# Patient Record
Sex: Male | Born: 1985 | Race: Black or African American | Hispanic: No | Marital: Single | Smoking: Current every day smoker
Health system: Southern US, Community
[De-identification: ages and names within clinical notes are randomized; demographics above are authoritative.]

## PROBLEM LIST (undated history)

## (undated) DIAGNOSIS — J45909 Unspecified asthma, uncomplicated: Secondary | ICD-10-CM

## (undated) DIAGNOSIS — S82891A Other fracture of right lower leg, initial encounter for closed fracture: Secondary | ICD-10-CM

---
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
## 2013-05-14 ENCOUNTER — Emergency Department (HOSPITAL_BASED_OUTPATIENT_CLINIC_OR_DEPARTMENT_OTHER)
Admission: EM | Admit: 2013-05-14 | Discharge: 2013-05-14 | Disposition: A | Discharge status: Home / Self Care | Payer: Self-pay | Attending: Emergency Medicine | Admitting: Emergency Medicine

## 2013-05-14 ENCOUNTER — Emergency Department (HOSPITAL_BASED_OUTPATIENT_CLINIC_OR_DEPARTMENT_OTHER): Payer: Self-pay

## 2013-05-14 ENCOUNTER — Encounter (HOSPITAL_BASED_OUTPATIENT_CLINIC_OR_DEPARTMENT_OTHER): Payer: Self-pay | Admitting: Emergency Medicine

## 2013-05-14 DIAGNOSIS — S0181XA Laceration without foreign body of other part of head, initial encounter: Secondary | ICD-10-CM

## 2013-05-14 DIAGNOSIS — R000 Tachycardia, unspecified: Secondary | ICD-10-CM | POA: Insufficient documentation

## 2013-05-14 DIAGNOSIS — Y9301 Activity, walking, marching and hiking: Secondary | ICD-10-CM | POA: Insufficient documentation

## 2013-05-14 DIAGNOSIS — F17200 Nicotine dependence, unspecified, uncomplicated: Secondary | ICD-10-CM | POA: Insufficient documentation

## 2013-05-14 DIAGNOSIS — S0180XA Unspecified open wound of other part of head, initial encounter: Secondary | ICD-10-CM | POA: Insufficient documentation

## 2013-05-14 DIAGNOSIS — J45909 Unspecified asthma, uncomplicated: Secondary | ICD-10-CM | POA: Insufficient documentation

## 2013-05-14 DIAGNOSIS — Y929 Unspecified place or not applicable: Secondary | ICD-10-CM | POA: Insufficient documentation

## 2013-05-14 DIAGNOSIS — R296 Repeated falls: Secondary | ICD-10-CM | POA: Insufficient documentation

## 2013-05-14 HISTORY — DX: Unspecified asthma, uncomplicated: J45.909

## 2013-05-14 LAB — BASIC METABOLIC PANEL
BUN: 11 mg/dL (ref 6–23)
CO2: 26 mEq/L (ref 19–32)
Calcium: 9.6 mg/dL (ref 8.4–10.5)
Chloride: 103 mEq/L (ref 96–112)
Creatinine, Ser: 0.9 mg/dL (ref 0.50–1.35)
GFR calc Af Amer: >90 mL/min (ref >90)
GFR calc non Af Amer: >90 mL/min (ref >90)
Glucose, Bld: 82 mg/dL (ref 70–99)
Potassium: 3.1 mEq/L — ABNORMAL LOW (ref 3.5–5.1)
Sodium: 142 mEq/L (ref 135–145)

## 2013-05-14 LAB — CBC WITH DIFFERENTIAL/PLATELET
Basophils Absolute: 0 10*3/uL (ref 0.0–0.1)
Basophils Relative: 1 % (ref 0–1)
Eosinophils Absolute: 0 10*3/uL (ref 0.0–0.7)
Eosinophils Relative: 0 % (ref 0–5)
HCT: 39.3 % (ref 39.0–52.0)
Hemoglobin: 13.4 g/dL (ref 13.0–17.0)
Lymphocytes Relative: 21 % (ref 12–46)
Lymphs Abs: 1.8 10*3/uL (ref 0.7–4.0)
MCH: 31.2 pg (ref 26.0–34.0)
MCHC: 34.1 g/dL (ref 30.0–36.0)
MCV: 91.4 fL (ref 78.0–100.0)
Monocytes Absolute: 0.5 10*3/uL (ref 0.1–1.0)
Monocytes Relative: 6 % (ref 3–12)
Neutro Abs: 6.3 10*3/uL (ref 1.7–7.7)
Neutrophils Relative %: 73 % (ref 43–77)
Platelets: 350 10*3/uL (ref 150–400)
RBC: 4.3 MIL/uL (ref 4.22–5.81)
RDW: 13.5 % (ref 11.5–15.5)
WBC: 8.6 10*3/uL (ref 4.0–10.5)

## 2013-05-14 MED ORDER — IOHEXOL 300 MG/ML  SOLN
80.0000 mL | Freq: Once | INTRAMUSCULAR | Status: AC | PRN
  Administered 2013-05-14: 80 mL via INTRAVENOUS

## 2013-05-14 MED ORDER — TETANUS-DIPHTHERIA TOXOIDS TD 5-2 LFU IM INJ
INJECTION | INTRAMUSCULAR | Status: AC
  Administered 2013-05-14: 0.5 mL via INTRAMUSCULAR
  Filled 2013-05-14: qty 0.5
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
## 2013-05-14 NOTE — ED Notes (Signed)
Pt informed that he would need to be transported to [facility] ED for consult with ENT doctor and pt agreed for transfer.

## 2013-05-14 NOTE — ED Notes (Addendum)
Pt nonverbal significant other reports pt fell when asked what he fell against pt reports he doesn't know
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
## 2013-05-14 NOTE — ED Notes (Signed)
Pt left AMA °

## 2013-05-14 NOTE — ED Notes (Signed)
Pt called out and upon contact, patient said he doesn't want any treatment, doesn't want to be transfer to [facility]. Pt educated on the potential risks, after being educated,  he was still very adamant about his decision. "I want to get this laceration suture up otherwise I will just go to [facility]". 10 min later, he was waiting outside his room demanding his IV to be pulled out and then started to pull his own IV out. After IV was pulled out, he got dressed and walked out the front door.

## 2017-11-06 ENCOUNTER — Other Ambulatory Visit: Payer: Self-pay

## 2017-11-06 ENCOUNTER — Emergency Department (HOSPITAL_BASED_OUTPATIENT_CLINIC_OR_DEPARTMENT_OTHER): Payer: Self-pay

## 2017-11-06 ENCOUNTER — Emergency Department (HOSPITAL_BASED_OUTPATIENT_CLINIC_OR_DEPARTMENT_OTHER)
Admission: EM | Admit: 2017-11-06 | Discharge: 2017-11-06 | Disposition: A | Discharge status: Home / Self Care | Payer: Self-pay | Attending: Emergency Medicine | Admitting: Emergency Medicine

## 2017-11-06 DIAGNOSIS — Y999 Unspecified external cause status: Secondary | ICD-10-CM | POA: Insufficient documentation

## 2017-11-06 DIAGNOSIS — S82831A Other fracture of upper and lower end of right fibula, initial encounter for closed fracture: Secondary | ICD-10-CM | POA: Insufficient documentation

## 2017-11-06 DIAGNOSIS — J45909 Unspecified asthma, uncomplicated: Secondary | ICD-10-CM | POA: Insufficient documentation

## 2017-11-06 DIAGNOSIS — W1789XA Other fall from one level to another, initial encounter: Secondary | ICD-10-CM | POA: Insufficient documentation

## 2017-11-06 DIAGNOSIS — F17210 Nicotine dependence, cigarettes, uncomplicated: Secondary | ICD-10-CM | POA: Insufficient documentation

## 2017-11-06 DIAGNOSIS — Y929 Unspecified place or not applicable: Secondary | ICD-10-CM | POA: Insufficient documentation

## 2017-11-06 DIAGNOSIS — Y939 Activity, unspecified: Secondary | ICD-10-CM | POA: Insufficient documentation

## 2017-11-06 MED ORDER — OXYCODONE-ACETAMINOPHEN 5-325 MG PO TABS: 1.0000 | ORAL_TABLET | Freq: Four times a day (QID) | ORAL | 0 refills | Status: DC | PRN

## 2017-11-06 MED ORDER — OXYCODONE-ACETAMINOPHEN 5-325 MG PO TABS: 2.0000 | ORAL_TABLET | Freq: Once | ORAL | Status: DC

## 2017-11-06 NOTE — Discharge Instructions (Signed)
Not put weight on your ankle until seen in follow-up by Dr. Durwin GlazeLandau  Percocet for pain.  Motrin for mild pain.

## 2017-11-06 NOTE — ED Provider Notes (Signed)
[facility] EMERGENCY DEPARTMENT Provider Note   CSN: [id number] Arrival date & time: 11/06/17  2228     History   Chief Complaint Chief Complaint  Patient presents with  . Ankle Pain    HPI Dustin King is a 32 y.o. male.  Chief complaint is ankle pain.  HPI: 32 year old male.  [place]y [place]  Lead of his right ankle.  It inverted.  He felt a pop and heard a cracking sound.  He cannot bear weight and has swelling.  There are areas of pain or injury.  He does not have pain or tenderness over the proximal lower leg.  Past Medical History:  Diagnosis Date  . Asthma     There are no active problems to display for this patient.   No past surgical history on file.     Home Medications    Prior to Admission medications   Medication Sig Start Date End Date Taking? Authorizing Provider  oxyCODONE-acetaminophen (PERCOCET/ROXICET) 5-325 MG tablet Take 1 tablet by mouth every 6 (six) hours as needed for moderate pain. 11/06/17   Rolland PorterJames, Ashton Sabine, MD    Family History No family history on file.  Social History Social History   Tobacco Use  . Smoking status: Current Every Day Smoker    Types: Cigarettes  Substance Use Topics  . Alcohol use: Yes  . Drug use: No     Allergies   Patient has no known allergies.   Review of Systems Review of Systems  Constitutional: Negative for appetite change, chills, diaphoresis, fatigue and fever.  HENT: Negative for mouth sores, sore throat and trouble swallowing.   Eyes: Negative for visual disturbance.  Respiratory: Negative for cough, chest tightness, shortness of breath and wheezing.   Cardiovascular: Negative for chest pain.  Gastrointestinal: Negative for abdominal distention, abdominal pain, diarrhea, nausea and vomiting.  Endocrine: Negative for polydipsia, polyphagia and polyuria.  Genitourinary: Negative for dysuria, frequency and hematuria.  Musculoskeletal: Positive for arthralgias and gait problem.   Skin: Negative for color change, pallor and rash.  Neurological: Negative for dizziness, syncope, light-headedness and headaches.  Hematological: Does not bruise/bleed easily.  Psychiatric/Behavioral: Negative for behavioral problems and confusion.     Physical Exam Updated Vital Signs BP (!) 144/126 (BP Location: Right Arm)   Pulse (!) 123   Temp 99 F (37.2 C) (Oral)   Resp 20   Ht 6' (1.829 m)   Wt 129.3 kg (285 lb)   SpO2 96%   BMI 38.65 kg/m   Physical Exam  Constitutional: He is oriented to person, place, and time. He appears well-developed and well-nourished. No distress.  HENT:  Head: Normocephalic.  Eyes: Conjunctivae are normal. Pupils are equal, round, and reactive to light. No scleral icterus.  Neck: Normal range of motion. Neck supple. No thyromegaly present.  Cardiovascular: Normal rate and regular rhythm. Exam reveals no gallop and no friction rub.  No murmur heard. Pulmonary/Chest: Effort normal and breath sounds normal. No respiratory distress. He has no wheezes. He has no rales.  Abdominal: Soft. Bowel sounds are normal. He exhibits no distension. There is no tenderness. There is no rebound.  Musculoskeletal: Normal range of motion.  Tenderness over the distal fibula.  No tenderness of the proximal tibia or fibula.  Normal range of motion of the knee.  No tender over the metatarsals including the base of the fifth.  Tissue swelling.  No ecchymosis.  Good sensation distally  Neurological: He is alert and oriented to  person, place, and time.  Skin: Skin is warm and dry. No rash noted.  Psychiatric: He has a normal mood and affect. His behavior is normal.     ED Treatments / Results  Labs (all labs ordered are listed, but only abnormal results are displayed) Labs Reviewed - No data to display  EKG  EKG Interpretation None       Radiology Dg Ankle Complete Right  Result Date: 11/06/2017 CLINICAL DATA:  Right ankle pain and swelling after fall off  porch. EXAM: RIGHT ANKLE - COMPLETE 3+ VIEW COMPARISON:  None. FINDINGS: Mildly displaced and minimally comminuted distal fibular fracture just proximal to the ankle mortise. No associated mortise widening. Tiny osseous fragment adjacent to the posterior tibial tubercle appears well corticated. No definite medial malleolar fracture. There is lateral soft tissue edema. IMPRESSION: 1. Mildly displaced and minimally comminuted distal fibular fracture. 2. Tiny osseous density posterior to the posterior malleolus appears well corticated and is likely chronic. Electronically Signed   By: Rubye Oaks M.D.   On: 11/06/2017 23:03    Procedures Procedures (including critical care time)  Medications Ordered in ED Medications  oxyCODONE-acetaminophen (PERCOCET/ROXICET) 5-325 MG per tablet 2 tablet (not administered)     Initial Impression / Assessment and Plan / ED Course  I have reviewed the triage vital signs and the nursing notes.  Pertinent labs & imaging results that were available during my care of the patient were reviewed by me and considered in my medical decision making (see chart for details).    X-rays show a distal fibula fracture proximal to the ankle mortise.  No obvious widening of the mortise.  Normal medial malleolus.  SPLINT APPLICATION Date/Time: 11:31 PM Authorized by: Claudean Kinds Consent: Verbal consent obtained. Risks and benefits: risks, benefits and alternatives were discussed Consent given by: patient Splint applied by: +orthopedic technician Location details: Ankle Splint type: posterior with sugar tong. Co-aptation Supplies used: Orthoglass, ace wraps Post-procedure: The splinted body part was neurovascularly unchanged following the procedure. Patient tolerance: Patient tolerated the procedure well with no immediate complications.  Was placed.  Remains neurovascular intact distally.  Was instructed to be nonweightbearing.  Given orthopedics for follow-up.   Percocet for pain.  Crutches.   Final Clinical Impressions(s) / ED Diagnoses   Final diagnoses:  Closed fracture of distal end of right fibula, unspecified fracture morphology, initial encounter    ED Discharge Orders        Ordered    oxyCODONE-acetaminophen (PERCOCET/ROXICET) 5-325 MG tablet  Every 6 hours PRN     11/06/17 2325       Rolland Porter, MD 11/06/17 [phone]

## 2017-11-06 NOTE — ED Notes (Signed)
Pt did not want to take ordered percocet
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
## 2017-11-06 NOTE — ED Notes (Signed)
Sprite given at d/c per pt request.

## 2017-11-10 ENCOUNTER — Encounter (HOSPITAL_BASED_OUTPATIENT_CLINIC_OR_DEPARTMENT_OTHER): Payer: Self-pay | Admitting: *Deleted

## 2017-11-10 ENCOUNTER — Other Ambulatory Visit: Payer: Self-pay

## 2017-11-11 ENCOUNTER — Other Ambulatory Visit: Payer: Self-pay

## 2017-11-11 ENCOUNTER — Ambulatory Visit (HOSPITAL_BASED_OUTPATIENT_CLINIC_OR_DEPARTMENT_OTHER)
Admission: RE | Admit: 2017-11-11 | Discharge: 2017-11-11 | Disposition: A | Discharge status: Home / Self Care | Payer: Self-pay | Source: Ambulatory Visit | Attending: Orthopedic Surgery | Admitting: Orthopedic Surgery

## 2017-11-11 ENCOUNTER — Encounter (HOSPITAL_BASED_OUTPATIENT_CLINIC_OR_DEPARTMENT_OTHER)
Admission: RE | Disposition: A | Discharge status: Home / Self Care | Payer: Self-pay | Source: Ambulatory Visit | Attending: Orthopedic Surgery

## 2017-11-11 ENCOUNTER — Encounter (HOSPITAL_BASED_OUTPATIENT_CLINIC_OR_DEPARTMENT_OTHER): Payer: Self-pay | Admitting: *Deleted

## 2017-11-11 ENCOUNTER — Ambulatory Visit (HOSPITAL_BASED_OUTPATIENT_CLINIC_OR_DEPARTMENT_OTHER): Payer: Self-pay | Admitting: Anesthesiology

## 2017-11-11 DIAGNOSIS — F17210 Nicotine dependence, cigarettes, uncomplicated: Secondary | ICD-10-CM | POA: Insufficient documentation

## 2017-11-11 DIAGNOSIS — S82891A Other fracture of right lower leg, initial encounter for closed fracture: Secondary | ICD-10-CM

## 2017-11-11 DIAGNOSIS — X501XXA Overexertion from prolonged static or awkward postures, initial encounter: Secondary | ICD-10-CM | POA: Insufficient documentation

## 2017-11-11 DIAGNOSIS — S82841A Displaced bimalleolar fracture of right lower leg, initial encounter for closed fracture: Secondary | ICD-10-CM | POA: Insufficient documentation

## 2017-11-11 DIAGNOSIS — W1789XA Other fall from one level to another, initial encounter: Secondary | ICD-10-CM | POA: Insufficient documentation

## 2017-11-11 DIAGNOSIS — Z6841 Body Mass Index (BMI) 40.0 and over, adult: Secondary | ICD-10-CM | POA: Insufficient documentation

## 2017-11-11 HISTORY — PX: ORIF FIBULA FRACTURE: SHX5114

## 2017-11-11 HISTORY — DX: Other fracture of right lower leg, initial encounter for closed fracture: S82.891A

## 2017-11-11 SURGERY — OPEN REDUCTION INTERNAL FIXATION (ORIF) FIBULA FRACTURE
Anesthesia: General | Site: Ankle | Laterality: Right

## 2017-11-11 MED ORDER — ONDANSETRON HCL 4 MG/2ML IJ SOLN
INTRAMUSCULAR | Status: DC | PRN
  Administered 2017-11-11: 4 mg via INTRAVENOUS

## 2017-11-11 MED ORDER — LIDOCAINE HCL (CARDIAC) 20 MG/ML IV SOLN
INTRAVENOUS | Status: DC | PRN
  Administered 2017-11-11: 60 mg via INTRAVENOUS

## 2017-11-11 MED ORDER — ONDANSETRON HCL 4 MG/2ML IJ SOLN
INTRAMUSCULAR | Status: AC
  Filled 2017-11-11: qty 2

## 2017-11-11 MED ORDER — DEXAMETHASONE SODIUM PHOSPHATE 10 MG/ML IJ SOLN
INTRAMUSCULAR | Status: AC
  Filled 2017-11-11: qty 1

## 2017-11-11 MED ORDER — CEFAZOLIN SODIUM-DEXTROSE 1-4 GM/50ML-% IV SOLN
INTRAVENOUS | Status: AC
  Filled 2017-11-11: qty 50

## 2017-11-11 MED ORDER — FENTANYL CITRATE (PF) 100 MCG/2ML IJ SOLN
50.0000 ug | INTRAMUSCULAR | Status: AC | PRN
  Administered 2017-11-11: 100 ug via INTRAVENOUS
  Administered 2017-11-11: 25 ug via INTRAVENOUS
  Administered 2017-11-11: 50 ug via INTRAVENOUS
  Administered 2017-11-11: 25 ug via INTRAVENOUS

## 2017-11-11 MED ORDER — MIDAZOLAM HCL 2 MG/2ML IJ SOLN
1.0000 mg | INTRAMUSCULAR | Status: DC | PRN
  Administered 2017-11-11: 2 mg via INTRAVENOUS

## 2017-11-11 MED ORDER — CEFAZOLIN SODIUM-DEXTROSE 2-4 GM/100ML-% IV SOLN
INTRAVENOUS | Status: AC
  Filled 2017-11-11: qty 100

## 2017-11-11 MED ORDER — PROPOFOL 10 MG/ML IV BOLUS
INTRAVENOUS | Status: DC | PRN
  Administered 2017-11-11: 300 mg via INTRAVENOUS

## 2017-11-11 MED ORDER — CEFAZOLIN SODIUM-DEXTROSE 2-4 GM/100ML-% IV SOLN
2.0000 g | INTRAVENOUS | Status: AC
  Administered 2017-11-11: 3 g via INTRAVENOUS

## 2017-11-11 MED ORDER — FENTANYL CITRATE (PF) 100 MCG/2ML IJ SOLN
INTRAMUSCULAR | Status: AC
  Filled 2017-11-11: qty 2

## 2017-11-11 MED ORDER — DEXAMETHASONE SODIUM PHOSPHATE 10 MG/ML IJ SOLN
INTRAMUSCULAR | Status: DC | PRN
  Administered 2017-11-11: 10 mg via INTRAVENOUS

## 2017-11-11 MED ORDER — LIDOCAINE 2% (20 MG/ML) 5 ML SYRINGE
INTRAMUSCULAR | Status: AC
  Filled 2017-11-11: qty 5

## 2017-11-11 MED ORDER — OXYCODONE HCL 5 MG/5ML PO SOLN: 5.0000 mg | Freq: Once | ORAL | Status: DC | PRN

## 2017-11-11 MED ORDER — PROMETHAZINE HCL 25 MG/ML IJ SOLN: 6.2500 mg | INTRAMUSCULAR | Status: DC | PRN

## 2017-11-11 MED ORDER — MIDAZOLAM HCL 2 MG/2ML IJ SOLN
INTRAMUSCULAR | Status: AC
  Filled 2017-11-11: qty 2

## 2017-11-11 MED ORDER — PHENYLEPHRINE 40 MCG/ML (10ML) SYRINGE FOR IV PUSH (FOR BLOOD PRESSURE SUPPORT)
PREFILLED_SYRINGE | INTRAVENOUS | Status: AC
Start: 2017-11-11 — End: ?
  Filled 2017-11-11: qty 30

## 2017-11-11 MED ORDER — GLYCOPYRROLATE 0.2 MG/ML IJ SOLN
INTRAMUSCULAR | Status: DC | PRN
  Administered 2017-11-11 (×2): 0.1 mg via INTRAVENOUS

## 2017-11-11 MED ORDER — PHENYLEPHRINE HCL 10 MG/ML IJ SOLN
INTRAVENOUS | Status: DC | PRN
  Administered 2017-11-11: 40 ug/min via INTRAVENOUS

## 2017-11-11 MED ORDER — PHENYLEPHRINE HCL 10 MG/ML IJ SOLN
INTRAMUSCULAR | Status: AC
  Filled 2017-11-11: qty 3

## 2017-11-11 MED ORDER — OXYCODONE HCL 5 MG PO TABS: 5.0000 mg | ORAL_TABLET | Freq: Once | ORAL | Status: DC | PRN

## 2017-11-11 MED ORDER — OXYCODONE-ACETAMINOPHEN 5-325 MG PO TABS: 1.0000 | ORAL_TABLET | ORAL | 0 refills | Status: AC | PRN

## 2017-11-11 MED ORDER — SCOPOLAMINE 1 MG/3DAYS TD PT72: 1.0000 | MEDICATED_PATCH | Freq: Once | TRANSDERMAL | Status: DC | PRN

## 2017-11-11 MED ORDER — PHENYLEPHRINE 40 MCG/ML (10ML) SYRINGE FOR IV PUSH (FOR BLOOD PRESSURE SUPPORT)
PREFILLED_SYRINGE | INTRAVENOUS | Status: DC | PRN
  Administered 2017-11-11 (×2): 120 ug via INTRAVENOUS
  Administered 2017-11-11 (×2): 80 ug via INTRAVENOUS

## 2017-11-11 MED ORDER — LACTATED RINGERS IV SOLN
INTRAVENOUS | Status: DC
  Administered 2017-11-11: 11:00:00 via INTRAVENOUS

## 2017-11-11 MED ORDER — MEPERIDINE HCL 25 MG/ML IJ SOLN: 6.2500 mg | INTRAMUSCULAR | Status: DC | PRN

## 2017-11-11 MED ORDER — PROPOFOL 10 MG/ML IV BOLUS
INTRAVENOUS | Status: AC
  Filled 2017-11-11: qty 20

## 2017-11-11 MED ORDER — FENTANYL CITRATE (PF) 100 MCG/2ML IJ SOLN
25.0000 ug | INTRAMUSCULAR | Status: DC | PRN
  Administered 2017-11-11: 50 ug via INTRAVENOUS

## 2017-11-11 MED ORDER — KETOROLAC TROMETHAMINE 30 MG/ML IJ SOLN: 30.0000 mg | Freq: Once | INTRAMUSCULAR | Status: DC | PRN

## 2017-11-11 SURGICAL SUPPLY — 79 items
ANKLE SYNDEMOSIS ZIPTIGHT (Ankle) ×3 IMPLANT
BANDAGE ACE 4X5 VEL STRL LF (GAUZE/BANDAGES/DRESSINGS) ×3 IMPLANT
BANDAGE ACE 6X5 VEL STRL LF (GAUZE/BANDAGES/DRESSINGS) ×3 IMPLANT
BANDAGE ESMARK 6X9 LF (GAUZE/BANDAGES/DRESSINGS) ×1 IMPLANT
BIT DRILL 110X2.5XQCK CNCT (BIT) IMPLANT
BIT DRILL 2.5 (BIT) ×3
BIT DRILL QC 110 3.5 (BIT) ×1
BIT DRILL QC 110 3.5MM (BIT) IMPLANT
BIT DRL 110X2.5XQCK CNCT (BIT) ×1
BLADE SURG 15 STRL LF DISP TIS (BLADE) ×2 IMPLANT
BLADE SURG 15 STRL SS (BLADE) ×9
BNDG CMPR 9X6 STRL LF SNTH (GAUZE/BANDAGES/DRESSINGS) ×1
BNDG COHESIVE 4X5 TAN STRL (GAUZE/BANDAGES/DRESSINGS) ×3 IMPLANT
BNDG ESMARK 6X9 LF (GAUZE/BANDAGES/DRESSINGS) ×3
CANISTER SUCT 1200ML W/VALVE (MISCELLANEOUS) ×3 IMPLANT
CLOSURE STERI-STRIP 1/2X4 (GAUZE/BANDAGES/DRESSINGS) ×1
CLSR STERI-STRIP ANTIMIC 1/2X4 (GAUZE/BANDAGES/DRESSINGS) ×1 IMPLANT
CUFF TOURNIQUET SINGLE 34IN LL (TOURNIQUET CUFF) ×2 IMPLANT
DECANTER SPIKE VIAL GLASS SM (MISCELLANEOUS) IMPLANT
DRAPE EXTREMITY T 121X128X90 (DRAPE) ×5 IMPLANT
DRAPE IMP U-DRAPE 54X76 (DRAPES) ×2 IMPLANT
DRAPE INCISE IOBAN 66X45 STRL (DRAPES) IMPLANT
DRAPE OEC MINIVIEW 54X84 (DRAPES) ×3 IMPLANT
DRAPE U-SHAPE 47X51 STRL (DRAPES) ×3 IMPLANT
DRILL BIT QC 110 3.5MM (BIT) ×3
DURAPREP 26ML APPLICATOR (WOUND CARE) ×7 IMPLANT
ELECT REM PT RETURN 9FT ADLT (ELECTROSURGICAL) ×3
ELECTRODE REM PT RTRN 9FT ADLT (ELECTROSURGICAL) ×1 IMPLANT
GAUZE SPONGE 4X4 12PLY STRL (GAUZE/BANDAGES/DRESSINGS) ×3 IMPLANT
GLOVE BIO SURGEON STRL SZ 6.5 (GLOVE) ×1 IMPLANT
GLOVE BIO SURGEON STRL SZ8 (GLOVE) ×3 IMPLANT
GLOVE BIO SURGEONS STRL SZ 6.5 (GLOVE) ×1
GLOVE BIOGEL M STRL SZ7.5 (GLOVE) ×2 IMPLANT
GLOVE BIOGEL PI IND STRL 7.0 (GLOVE) IMPLANT
GLOVE BIOGEL PI IND STRL 8 (GLOVE) ×2 IMPLANT
GLOVE BIOGEL PI INDICATOR 7.0 (GLOVE) ×4
GLOVE BIOGEL PI INDICATOR 8 (GLOVE) ×6
GLOVE ORTHO TXT STRL SZ7.5 (GLOVE) ×3 IMPLANT
GOWN STRL REUS W/ TWL LRG LVL3 (GOWN DISPOSABLE) ×1 IMPLANT
GOWN STRL REUS W/ TWL XL LVL3 (GOWN DISPOSABLE) ×2 IMPLANT
GOWN STRL REUS W/TWL LRG LVL3 (GOWN DISPOSABLE) ×3
GOWN STRL REUS W/TWL XL LVL3 (GOWN DISPOSABLE) ×6
NS IRRIG 1000ML POUR BTL (IV SOLUTION) ×3 IMPLANT
PACK ARTHROSCOPY DSU (CUSTOM PROCEDURE TRAY) ×1 IMPLANT
PACK BASIN DAY SURGERY FS (CUSTOM PROCEDURE TRAY) ×3 IMPLANT
PAD CAST 4YDX4 CTTN HI CHSV (CAST SUPPLIES) ×1 IMPLANT
PADDING CAST ABS 4INX4YD NS (CAST SUPPLIES) ×2
PADDING CAST ABS COTTON 4X4 ST (CAST SUPPLIES) ×1 IMPLANT
PADDING CAST COTTON 4X4 STRL (CAST SUPPLIES) ×3
PADDING CAST COTTON 6X4 STRL (CAST SUPPLIES) ×3 IMPLANT
PENCIL BUTTON HOLSTER BLD 10FT (ELECTRODE) ×3 IMPLANT
PLATE 7HOLE 1/3 TUBULAR (Plate) ×2 IMPLANT
SCREW CANC 2.5XFT 16X4XST SM (Screw) IMPLANT
SCREW CANC 4.0X16 (Screw) ×6 IMPLANT
SCREW CORTICAL 3.5 18MM (Screw) ×2 IMPLANT
SCREW CORTICAL 3.5X12 (Screw) ×2 IMPLANT
SCREW CORTICAL 3.5X14 (Screw) ×2 IMPLANT
SCREW CORTICAL NONLOCK 3.5X55 (Screw) ×2 IMPLANT
SLEEVE SCD COMPRESS KNEE MED (MISCELLANEOUS) ×3 IMPLANT
SPLINT FAST PLASTER 5X30 (CAST SUPPLIES) ×40
SPLINT PLASTER CAST FAST 5X30 (CAST SUPPLIES) ×1 IMPLANT
SPONGE LAP 4X18 X RAY DECT (DISPOSABLE) ×3 IMPLANT
STAPLER VISISTAT 35W (STAPLE) IMPLANT
SUCTION FRAZIER HANDLE 10FR (MISCELLANEOUS) ×2
SUCTION TUBE FRAZIER 10FR DISP (MISCELLANEOUS) ×1 IMPLANT
SUT ETHILON 3 0 PS 1 (SUTURE) IMPLANT
SUT ETHILON 4 0 PS 2 18 (SUTURE) IMPLANT
SUT MNCRL AB 4-0 PS2 18 (SUTURE) IMPLANT
SUT VIC AB 2-0 SH 18 (SUTURE) IMPLANT
SUT VIC AB 2-0 SH 27 (SUTURE)
SUT VIC AB 2-0 SH 27XBRD (SUTURE) IMPLANT
SUT VICRYL 3-0 CR8 SH (SUTURE) ×2 IMPLANT
SUT VICRYL 4-0 PS2 18IN ABS (SUTURE) IMPLANT
SYR BULB 3OZ (MISCELLANEOUS) ×3 IMPLANT
SYSTEM FIXATN ANKL SYNDESMOSIS (Ankle) IMPLANT
TOWEL OR 17X24 6PK STRL BLUE (TOWEL DISPOSABLE) ×3 IMPLANT
TOWEL OR NON WOVEN STRL DISP B (DISPOSABLE) ×2 IMPLANT
UNDERPAD 30X30 (UNDERPADS AND DIAPERS) ×3 IMPLANT
YANKAUER SUCT BULB TIP NO VENT (SUCTIONS) ×2 IMPLANT

## 2017-11-11 NOTE — Anesthesia Procedure Notes (Signed)
Anesthesia Regional Block: Popliteal block   Pre-Anesthetic Checklist: ,, timeout performed, Correct Patient, Correct Site, Correct Laterality, Correct Procedure, Correct Position, site marked, Risks and benefits discussed,  Surgical consent,  Pre-op evaluation,  At surgeon's request and post-op pain management  Laterality: Right  Prep: chloraprep       Needles:  Injection technique: Single-shot  Needle Type: Stimiplex     Needle Length: 10cm  Needle Gauge: 21     Additional Needles:   Procedures:,,,, ultrasound used (permanent image in chart),,,,  Motor weakness within 5 minutes.  Narrative:  Start time: 11/11/2017 11:55 AM End time: 11/11/2017 12:00 PM Injection made incrementally with aspirations every 5 mL.  Performed by: Personally  Anesthesiologist: Lewie LoronGermeroth, Iviana Blasingame, MD  Additional Notes: Nerve located and needle positioned with direct ultrasound guidance. Good perineural spread. Patient tolerated well.

## 2017-11-11 NOTE — Discharge Instructions (Signed)
Diet: As you were doing prior to hospitalization   Shower:  May shower but keep the wounds dry, use an occlusive plastic wrap, NO SOAKING IN TUB.  If the bandage gets wet, change with a clean dry gauze.  If you have a splint on, leave the splint in place and keep the splint dry with a plastic bag.  Dressing:  You may change your dressing 3-5 days after surgery, unless you have a splint.  If you have a splint, then just leave the splint in place and we will change your bandages during your first follow-up appointment.    If you had hand or foot surgery, we will plan to remove your stitches in about 2 weeks in the office.  For all other surgeries, there are sticky tapes (steri-strips) on your wounds and all the stitches are absorbable.  Leave the steri-strips in place when changing your dressings, they will peel off with time, usually 2-3 weeks.  Activity:  Increase activity slowly as tolerated, but follow the weight bearing instructions below.  The rules on driving is that you can not be taking narcotics while you drive, and you must feel in control of the vehicle.    Weight Bearing:   NO WEIGHT ON RIGHT LEG.    To prevent constipation: you may use a stool softener such as -  Colace (over the counter) 100 mg by mouth twice a day  Drink plenty of fluids (prune juice may be helpful) and high fiber foods Miralax (over the counter) for constipation as needed.    Itching:  If you experience itching with your medications, try taking only a single pain pill, or even half a pain pill at a time.  You may take up to 10 pain pills per day, and you can also use benadryl over the counter for itching or also to help with sleep.   Precautions:  If you experience chest pain or shortness of breath - call 911 immediately for transfer to the hospital emergency department!!  If you develop a fever greater that 101 F, purulent drainage from wound, increased redness or drainage from wound, or calf pain -- Call the  office at [phone]                                                Follow- Up Appointment:  Please call for an appointment to be seen in 2 weeks [place] - [phone]   Regional Anesthesia Blocks  1. Numbness or the inability to move the "blocked" extremity may last from 3-48 hours after placement. The length of time depends on the medication injected and your individual response to the medication. If the numbness is not going away after 48 hours, call your surgeon.  2. The extremity that is blocked will need to be protected until the numbness is gone and the  Strength has returned. Because you cannot feel it, you will need to take extra care to avoid injury. Because it may be weak, you may have difficulty moving it or using it. You may not know what position it is in without looking at it while the block is in effect.  3. For blocks in the legs and feet, returning to weight bearing and walking needs to be done carefully. You will need to wait until the numbness is entirely gone and the strength has returned. You  should be able to move your leg and foot normally before you try and bear weight or walk. You will need someone to be with you when you first try to ensure you do not fall and possibly risk injury.  4. Bruising and tenderness at the needle site are common side effects and will resolve in a few days.  5. Persistent numbness or new problems with movement should be communicated to the surgeon or the [facility] [phone])/ [facility] [phone]).   Post Anesthesia Home Care Instructions  Activity: Get plenty of rest for the remainder of the day. A responsible individual must stay with you for 24 hours following the procedure.  For the next 24 hours, DO NOT: -Drive a car -Advertising copywriterperate machinery -Drink alcoholic beverages -Take any medication unless instructed by your physician -Make any legal decisions or sign important papers.  Meals: Start with  liquid foods such as gelatin or soup. Progress to regular foods as tolerated. Avoid greasy, spicy, heavy foods. If nausea and/or vomiting occur, drink only clear liquids until the nausea and/or vomiting subsides. Call your physician if vomiting continues.  Special Instructions/Symptoms: Your throat may feel dry or sore from the anesthesia or the breathing tube placed in your throat during surgery. If this causes discomfort, gargle with warm salt water. The discomfort should disappear within 24 hours.  If you had a scopolamine patch placed behind your ear for the management of post- operative nausea and/or vomiting:  1. The medication in the patch is effective for 72 hours, after which it should be removed.  Wrap patch in a tissue and discard in the trash. Wash hands thoroughly with soap and water. 2. You may remove the patch earlier than 72 hours if you experience unpleasant side effects which may include dry mouth, dizziness or visual disturbances. 3. Avoid touching the patch. Wash your hands with soap and water after contact with the patch.

## 2017-11-11 NOTE — Anesthesia Procedure Notes (Signed)
Procedure Name: LMA Insertion Date/Time: 11/11/2017 1:25 PM Performed by: Burna Cashonrad, Socorro Kanitz C, CRNA Pre-anesthesia Checklist: Patient identified, Emergency Drugs available, Suction available and Patient being monitored Patient Re-evaluated:Patient Re-evaluated prior to induction Oxygen Delivery Method: Circle system utilized Preoxygenation: Pre-oxygenation with 100% oxygen Induction Type: IV induction Ventilation: Mask ventilation without difficulty LMA: LMA inserted LMA Size: 5.0 Number of attempts: 1 Airway Equipment and Method: Bite block Placement Confirmation: positive ETCO2 Tube secured with: Tape Dental Injury: Teeth and Oropharynx as per pre-operative assessment

## 2017-11-11 NOTE — Transfer of Care (Signed)
Immediate Anesthesia Transfer of Care Note  Patient: Dustin King  Procedure(s) Performed: OPEN REDUCTION INTERNAL FIXATION (ORIF) RIGHT DISTAL FIBULA FRACTURE (Right Ankle)  Patient Location: PACU  Anesthesia Type:General  Level of Consciousness: awake and sedated  Airway & Oxygen Therapy: Patient Spontanous Breathing and Patient connected to face mask oxygen  Post-op Assessment: Report given to RN and Post -op Vital signs reviewed and stable  Post vital signs: Reviewed and stable  Last Vitals:  Vitals:   11/11/17 1245 11/11/17 1300  BP: (!) 142/73 (!) 143/78  Pulse: 81 85  Resp: (!) 30 (!) 29  Temp:    SpO2: 100% 100%    Last Pain:  Vitals:   11/11/17 1023  TempSrc: Oral  PainSc: 2       Patients Stated Pain Goal: 2 (11/11/17 1023)  Complications: No apparent anesthesia complications

## 2017-11-11 NOTE — Anesthesia Preprocedure Evaluation (Signed)
Anesthesia Evaluation  Patient identified by MRN, date of birth, ID band Patient awake    Reviewed: Allergy & Precautions, NPO status , Patient's Chart, lab work & pertinent test results  Airway Mallampati: II  TM Distance: >3 FB Neck ROM: Full    Dental no notable dental hx.    Pulmonary asthma , Current Smoker,    Pulmonary exam normal breath sounds clear to auscultation       Cardiovascular negative cardio ROS Normal cardiovascular exam Rhythm:Regular Rate:Normal     Neuro/Psych negative neurological ROS  negative psych ROS   GI/Hepatic negative GI ROS, Neg liver ROS,   Endo/Other  Morbid obesity  Renal/GU negative Renal ROS     Musculoskeletal negative musculoskeletal ROS (+)   Abdominal   Peds  Hematology negative hematology ROS (+)   Anesthesia Other Findings   Reproductive/Obstetrics                             Anesthesia Physical Anesthesia Plan  ASA: III  Anesthesia Plan: General   Post-op Pain Management: GA combined w/ Regional for post-op pain   Induction: Intravenous  PONV Risk Score and Plan: 1 and Ondansetron and Dexamethasone  Airway Management Planned: LMA  Additional Equipment:   Intra-op Plan:   Post-operative Plan: Extubation in OR  Informed Consent: I have reviewed the patients History and Physical, chart, labs and discussed the procedure including the risks, benefits and alternatives for the proposed anesthesia with the patient or authorized representative who has indicated his/her understanding and acceptance.   Dental advisory given  Plan Discussed with: CRNA  Anesthesia Plan Comments:        Anesthesia Quick Evaluation

## 2017-11-11 NOTE — Progress Notes (Signed)
lAssisted Dr. Renold DonGermeroth with right, ultrasound guided, popliteal/saphenous block. Side rails up, monitors on throughout procedure. See vital signs in flow sheet. Tolerated Procedure well.

## 2017-11-11 NOTE — Op Note (Signed)
11/11/2017  PATIENT:  Dustin King    PRE-OPERATIVE DIAGNOSIS: Right bimalleolar ankle fracture  POST-OPERATIVE DIAGNOSIS: Right bimalleolar ankle fracture with involvement of the fibula, posterior malleolus, with syndesmotic disruption  PROCEDURE:    1.  Open Reduction Internal Fixation Fibula 2.  3 views plus a stress view of the right ankle            SURGEON:  Eulas PostJoshua P Yamilet Mcfayden, MD  PHYSICIAN ASSISTANT: Janace LittenBrandon Parry, OPA-C, present and scrubbed throughout the case, critical for completion in a timely fashion, and for retraction, instrumentation, and closure.  ANESTHESIA:   General with a popliteal block  ESTIMATED BLOOD LOSS: 50 mL  UNIQUE ASPECTS OF THE CASE: The canal of the fibula was fairly small.  The apex of the fibula was very medial, such that I had to get fairly medial on the lag screw.  There was a comminuted posterior piece of the fibula, but I was able to assess the length off of the anterior fragment.  The syndesmosis appeared unstable, I was actually surprised at that, but the mortise view and is stressing with the clamp mistreated instability.  During the fixation of the syndesmosis when I applied the King tong clamp, it seemed to now reduce the fibula, so I released the clamp and manually reduce the fibula to the tibia during the initial fixation with the syndesmotic screw.  PREOPERATIVE INDICATIONS:  Dustin King is a  32 y.o. male who broke his right ankle and who elected for surgical management to minimize the risk for malunion and nonunion and post-traumatic arthritis.    The risks benefits and alternatives were discussed with the patient preoperatively including but not limited to the risks of infection, bleeding, nerve injury, cardiopulmonary complications, the need for revision surgery, the need for hardware removal, among others, and the patient was willing to proceed.  OPERATIVE IMPLANTS: Biomet / Zimmer 1/3 tubular plate, with a single  interfragmentary lag screw.  I used a tricortical screw for the syndesmosis through the distal segment, and then used an additional fixation for the syndesmosis with a button.  OPERATIVE PROCEDURE: The patient was brought to the operating room and placed in the supine position. All bony prominences were padded. General anesthesia was administered. The lower extremity was prepped and draped in the usual sterile fashion. The leg was elevated and exsanguinated and the tourniquet was inflated. Time out was performed.  I did prescrub the foot twice, in order to try to minimize infection given the apparent hygiene.  Incision was made over the distal fibula and the fracture was exposed and reduced anatomically with a clamp.  The small posterior comminuted piece was removed.  It took some maneuvering to get full length, but ultimately achieved anatomic alignment.  A lag screw was placed. I then applied a 1/3 tubular plate and secured it proximally and distally non-locking screws. Bone quality was reasonably good. I used c-arm to confirm satisfactory reduction and fixation.  I had 2 cancellus screws distally and one more open hole, which I used for the syndesmosis.  The syndesmosis was stressed using live fluoroscopy and found to be unstable.   I tried placing a clamp, but this mall reduce the syndesmosis and so I manually reduced the syndesmosis and then drilled across placing the interlocking tricortical screw.  I backed this up with a button through the hole that was 1 level up.  The syndesmosis was restored to near anatomic alignment.  The wounds were irrigated, and closed with  vicryl with routine closure for the skin. The wounds were injected with local anesthetic. Sterile gauze was applied followed by a posterior splint. He was awakened and returned to the PACU in stable and satisfactory condition. There were no complications.  3-Views plus a stress view of the ankle demonstrated appropriate reconstruction  of the mortise with internal fixation and appropriate stability of the syndesmosis.  The stress view taken intraoperatively demonstrated instability of the mortise as reflected above.

## 2017-11-11 NOTE — H&P (Signed)
PREOPERATIVE H&P  Chief Complaint: Right ankle pain  HPI: Dustin King is a 32 y.o. male who presents for preoperative history and physical with a diagnosis of right displaced fibula fracture. Symptoms are rated as moderate to severe, and have been worsening.  This is significantly impairing activities of daily living.  He has elected for surgical management.  Injury occurred after twisting and falling off of a porch.  This happened about a week ago.    Past Medical History:  Diagnosis Date  . Asthma    as a child   History reviewed. No pertinent surgical history. Social History   Socioeconomic History  . Marital status: Single    Spouse name: None  . Number of children: None  . Years of education: None  . Highest education level: None  Social Needs  . Financial resource strain: None  . Food insecurity - worry: None  . Food insecurity - inability: None  . Transportation needs - medical: None  . Transportation needs - non-medical: None  Occupational History  . None  Tobacco Use  . Smoking status: Current Every Day Smoker    Packs/day: 0.50    Types: Cigarettes  . Smokeless tobacco: Never Used  Substance and Sexual Activity  . Alcohol use: Yes  . Drug use: No  . Sexual activity: Yes  Other Topics Concern  . None  Social History Narrative  . None   History reviewed. No pertinent family history. No Known Allergies Prior to Admission medications   Medication Sig Start Date End Date Taking? Authorizing Provider  oxyCODONE-acetaminophen (PERCOCET/ROXICET) 5-325 MG tablet Take 1 tablet by mouth every 6 (six) hours as needed for moderate pain. 11/06/17  Yes Rolland PorterJames, Mark, MD     Positive ROS: All other systems have been reviewed and were otherwise negative with the exception of those mentioned in the HPI and as above.  Physical Exam: General: Alert, no acute distress Cardiovascular: No pedal edema Respiratory: No cyanosis, no use of accessory musculature GI: No  organomegaly, abdomen is soft and non-tender Skin: No lesions in the area of chief complaint Neurologic: Sensation intact distally Psychiatric: Patient is competent for consent with normal mood and affect Lymphatic: No axillary or cervical lymphadenopathy  MUSCULOSKELETAL:.  Right ankle has positive ecchymosis and pain to palpation with soft tissue swelling laterally  Sensation and motor intact distally.  Assessment: Displaced right distal fibula fracture   Plan: Plan for Procedure(s): OPEN REDUCTION INTERNAL FIXATION (ORIF) RIGHT DISTAL FIBULA FRACTURE  The risks benefits and alternatives were discussed with the patient including but not limited to the risks of nonoperative treatment, versus surgical intervention including infection, bleeding, nerve injury, malunion, nonunion, the need for revision surgery, hardware prominence, hardware failure, the need for hardware removal, blood clots, cardiopulmonary complications, morbidity, mortality, among others, and they were willing to proceed.     Dustin PostJoshua P Socorro Kanitz, MD Cell [phone]   11/11/2017 12:41 PM

## 2017-11-12 NOTE — Anesthesia Postprocedure Evaluation (Signed)
Anesthesia Post Note  Patient: Dustin King  Procedure(s) Performed: OPEN REDUCTION INTERNAL FIXATION (ORIF) RIGHT DISTAL FIBULA FRACTURE (Right Ankle)     Patient location during evaluation: PACU Anesthesia Type: General Level of consciousness: sedated and patient cooperative Pain management: pain level controlled Vital Signs Assessment: post-procedure vital signs reviewed and stable Respiratory status: spontaneous breathing Cardiovascular status: stable Anesthetic complications: no    Last Vitals:  Vitals:   11/11/17 1600 11/11/17 1701  BP: (!) 131/94 138/86  Pulse: (!) 102 (!) 105  Resp: 19 18  Temp:  36.7 C  SpO2: 98% 100%    Last Pain:  Vitals:   11/11/17 1701  TempSrc: Oral  PainSc: 0-No pain                 Lewie LoronJohn Marylynne Keelin

## 2017-11-15 ENCOUNTER — Encounter (HOSPITAL_BASED_OUTPATIENT_CLINIC_OR_DEPARTMENT_OTHER): Payer: Self-pay | Admitting: Orthopedic Surgery

## 2018-06-29 ENCOUNTER — Emergency Department (HOSPITAL_BASED_OUTPATIENT_CLINIC_OR_DEPARTMENT_OTHER)
Admission: EM | Admit: 2018-06-29 | Discharge: 2018-06-29 | Disposition: A | Discharge status: Home / Self Care | Payer: Self-pay | Attending: Emergency Medicine | Admitting: Emergency Medicine

## 2018-06-29 ENCOUNTER — Other Ambulatory Visit: Payer: Self-pay

## 2018-06-29 ENCOUNTER — Encounter (HOSPITAL_BASED_OUTPATIENT_CLINIC_OR_DEPARTMENT_OTHER): Payer: Self-pay | Admitting: Emergency Medicine

## 2018-06-29 DIAGNOSIS — Y929 Unspecified place or not applicable: Secondary | ICD-10-CM | POA: Insufficient documentation

## 2018-06-29 DIAGNOSIS — W2203XA Walked into furniture, initial encounter: Secondary | ICD-10-CM | POA: Insufficient documentation

## 2018-06-29 DIAGNOSIS — Y939 Activity, unspecified: Secondary | ICD-10-CM | POA: Insufficient documentation

## 2018-06-29 DIAGNOSIS — F17210 Nicotine dependence, cigarettes, uncomplicated: Secondary | ICD-10-CM | POA: Insufficient documentation

## 2018-06-29 DIAGNOSIS — Y999 Unspecified external cause status: Secondary | ICD-10-CM | POA: Insufficient documentation

## 2018-06-29 DIAGNOSIS — S61411A Laceration without foreign body of right hand, initial encounter: Secondary | ICD-10-CM | POA: Insufficient documentation

## 2018-06-29 MED ORDER — LIDOCAINE-EPINEPHRINE (PF) 2 %-1:200000 IJ SOLN
10.0000 mL | Freq: Once | INTRAMUSCULAR | Status: AC
  Administered 2018-06-29: 10 mL via INTRADERMAL
  Filled 2018-06-29: qty 10

## 2018-06-29 MED ORDER — LIDOCAINE-EPINEPHRINE (PF) 2 %-1:200000 IJ SOLN
INTRAMUSCULAR | Status: AC
  Administered 2018-06-29: 10 mL via INTRADERMAL
  Filled 2018-06-29: qty 10

## 2018-06-29 MED ORDER — LIDOCAINE-EPINEPHRINE 2 %-1:100000 IJ SOLN
20.0000 mL | Freq: Once | INTRAMUSCULAR | Status: DC
Start: 2018-06-29 — End: 2018-06-29
  Filled 2018-06-29: qty 20

## 2018-06-29 NOTE — ED Notes (Signed)
ED Provider at bedside. 

## 2018-06-29 NOTE — ED Provider Notes (Signed)
[facility] EMERGENCY DEPARTMENT Provider Note   CSN: [id number] Arrival date & time: 06/29/18  0135     History   Chief Complaint Chief Complaint  Patient presents with  . Laceration    HPI Dustin King is a 32 y.o. male.  HPI  This is a 32 year old male who presents with a laceration to the right hand.  Patient reports that he is right-handed.  He hit the back of his right hand on a piece of furniture.  Last tetanus shot was within 1 year.  Denies any numbness or tingling of the fingers.  Denies other injury.  Denies significant pain at this time.  Past Medical History:  Diagnosis Date  . Ankle fracture, right 11/11/2017  . Asthma    as a child    Patient Active Problem List   Diagnosis Date Noted  . Ankle fracture, right 11/11/2017    Past Surgical History:  Procedure Laterality Date  . ORIF FIBULA FRACTURE Right 11/11/2017   Procedure: OPEN REDUCTION INTERNAL FIXATION (ORIF) RIGHT DISTAL FIBULA FRACTURE;  Surgeon: Teryl Lucy, MD;  Location: [facility];  Service: Orthopedics;  Laterality: Right;        Home Medications    Prior to Admission medications   Medication Sig Start Date End Date Taking? Authorizing Provider  oxyCODONE-acetaminophen (PERCOCET/ROXICET) 5-325 MG tablet Take 1 tablet by mouth every 4 (four) hours as needed for moderate pain. 11/11/17   Teryl Lucy, MD    Family History No family history on file.  Social History Social History   Tobacco Use  . Smoking status: Current Every Day Smoker    Packs/day: 0.50    Types: Cigarettes  . Smokeless tobacco: Never Used  Substance Use Topics  . Alcohol use: Yes  . Drug use: No     Allergies   Patient has no known allergies.   Review of Systems Review of Systems  Musculoskeletal:       Right hand pain  Skin: Positive for wound.  Neurological: Negative for weakness and numbness.  All other systems reviewed and are negative.    Physical  Exam Updated Vital Signs BP 109/88 (BP Location: Left Arm)   Pulse (!) 108   Temp 98.3 F (36.8 C) (Oral)   Resp 18   Ht 1.829 m (6')   Wt 122.5 kg   SpO2 98%   BMI 36.62 kg/m   Physical Exam  Constitutional: He is oriented to person, place, and time. He appears well-developed and well-nourished.  Obese, no acute distress  HENT:  Head: Normocephalic and atraumatic.  Cardiovascular: Normal rate and regular rhythm.  Pulmonary/Chest: Effort normal. No respiratory distress.  Musculoskeletal: He exhibits no edema.  Focused examination of the right hand with 8 cm V-shaped avulsion laceration just proximal to the left fourth MCP joint, bleeding controlled, normal flexion and extension of all 5 digits, no exposure of tendon  Neurological: He is alert and oriented to person, place, and time.  Skin: Skin is warm and dry.  Psychiatric: He has a normal mood and affect.  Nursing note and vitals reviewed.    ED Treatments / Results  Labs (all labs ordered are listed, but only abnormal results are displayed) Labs Reviewed - No data to display  EKG None  Radiology No results found.  Procedures .Marland KitchenLaceration Repair Date/Time: 06/29/2018 2:57 AM Performed by: Shon Baton, MD Authorized by: Shon Baton, MD   Consent:    Consent obtained:  Verbal  Consent given by:  Patient   Risks discussed:  Infection, pain and retained foreign body   Alternatives discussed:  No treatment Anesthesia (see MAR for exact dosages):    Anesthesia method:  Local infiltration   Local anesthetic:  Lidocaine 2% WITH epi Laceration details:    Location:  Hand   Hand location:  R hand, dorsum   Length (cm):  8   Depth (mm):  5 Repair type:    Repair type:  Simple Pre-procedure details:    Preparation:  Patient was prepped and draped in usual sterile fashion Exploration:    Wound exploration: wound explored through full range of motion and entire depth of wound probed and visualized      Wound extent: no nerve damage noted and no tendon damage noted     Contaminated: no   Treatment:    Area cleansed with:  Betadine and saline   Amount of cleaning:  Standard   Irrigation solution:  Sterile water   Irrigation method:  Syringe   Visualized foreign bodies/material removed: no   Skin repair:    Repair method:  Sutures   Suture size:  4-0   Suture material:  Nylon   Suture technique:  Simple interrupted   Number of sutures:  7 Approximation:    Approximation:  Close Post-procedure details:    Dressing:  Antibiotic ointment and non-adherent dressing   Patient tolerance of procedure:  Tolerated well, no immediate complications   (including critical care time)  Medications Ordered in ED Medications  lidocaine-EPINEPHrine (XYLOCAINE W/EPI) 2 %-1:200000 (PF) injection 10 mL (10 mLs Intradermal Given 06/29/18 0221)     Initial Impression / Assessment and Plan / ED Course  I have reviewed the triage vital signs and the nursing notes.  Pertinent labs & imaging results that were available during my care of the patient were reviewed by me and considered in my medical decision making (see chart for details).     Patient presents with a laceration to the right hand.  Flexion and extension intact.  No bony deformities or suggestion of fracture.  No obvious tendon or vascular injury.  Injury was repaired.  Tetanus is up-to-date.  Return for suture removal in approximately 10 days.  After history, exam, and medical workup I feel the patient has been appropriately medically screened and is safe for discharge home. Pertinent diagnoses were discussed with the patient. Patient was given return precautions.   Final Clinical Impressions(s) / ED Diagnoses   Final diagnoses:  Laceration of right hand without foreign body, initial encounter    ED Discharge Orders    None       Climmie Buelow, Mayer Masker, MD 06/29/18 [phone]

## 2018-06-29 NOTE — ED Triage Notes (Signed)
Hit posterior right hand on metal object, has V shaped laceration to back of hand.

## 2018-06-29 NOTE — ED Notes (Signed)
Family at bedside. 

## 2018-08-24 ENCOUNTER — Other Ambulatory Visit: Payer: Self-pay

## 2018-08-24 ENCOUNTER — Encounter (HOSPITAL_BASED_OUTPATIENT_CLINIC_OR_DEPARTMENT_OTHER): Payer: Self-pay

## 2018-08-24 ENCOUNTER — Emergency Department (HOSPITAL_BASED_OUTPATIENT_CLINIC_OR_DEPARTMENT_OTHER)
Admission: EM | Admit: 2018-08-24 | Discharge: 2018-08-24 | Disposition: A | Discharge status: Home / Self Care | Payer: Self-pay | Attending: Emergency Medicine | Admitting: Emergency Medicine

## 2018-08-24 DIAGNOSIS — J111 Influenza due to unidentified influenza virus with other respiratory manifestations: Secondary | ICD-10-CM | POA: Insufficient documentation

## 2018-08-24 DIAGNOSIS — R69 Illness, unspecified: Secondary | ICD-10-CM

## 2018-08-24 DIAGNOSIS — F17210 Nicotine dependence, cigarettes, uncomplicated: Secondary | ICD-10-CM | POA: Insufficient documentation

## 2018-08-24 MED ORDER — OSELTAMIVIR PHOSPHATE 75 MG PO CAPS: 75.0000 mg | ORAL_CAPSULE | Freq: Two times a day (BID) | ORAL | 0 refills | Status: AC

## 2018-08-24 MED ORDER — OSELTAMIVIR PHOSPHATE 75 MG PO CAPS
75.0000 mg | ORAL_CAPSULE | Freq: Once | ORAL | Status: AC
  Administered 2018-08-24: 75 mg via ORAL
  Filled 2018-08-24: qty 1

## 2018-08-24 MED ORDER — IBUPROFEN 800 MG PO TABS
800.0000 mg | ORAL_TABLET | Freq: Once | ORAL | Status: AC
  Administered 2018-08-24: 800 mg via ORAL
  Filled 2018-08-24: qty 1

## 2018-08-24 NOTE — ED Provider Notes (Signed)
[facility] EMERGENCY DEPARTMENT Provider Note   CSN: [id number] Arrival date & time: 08/24/18  2227     History   Chief Complaint Chief Complaint  Patient presents with  . Generalized Body Aches    HPI Dustin King is a 32 y.o. male.  Patient presenting with flulike symptoms including generalized body aches, subjective fevers, cough, congestion since 230 today.  Patient not tried anything for it.  Says it feels like he has the flu.  Patient works at [employer] and is around multiple sick contacts full-time.  The history is provided by the patient.  Influenza  Presenting symptoms: cough, fever, headache, rhinorrhea and sore throat   Severity:  Mild Onset quality:  Sudden Progression:  Worsening Chronicity:  New Relieved by:  Nothing Worsened by:  Nothing Ineffective treatments:  None tried Associated symptoms: chills and nasal congestion     Past Medical History:  Diagnosis Date  . Ankle fracture, right 11/11/2017  . Asthma    as a child    Patient Active Problem List   Diagnosis Date Noted  . Ankle fracture, right 11/11/2017    Past Surgical History:  Procedure Laterality Date  . ORIF FIBULA FRACTURE Right 11/11/2017   Procedure: OPEN REDUCTION INTERNAL FIXATION (ORIF) RIGHT DISTAL FIBULA FRACTURE;  Surgeon: Teryl Lucy, MD;  Location: [facility];  Service: Orthopedics;  Laterality: Right;        Home Medications    Prior to Admission medications   Medication Sig Start Date End Date Taking? Authorizing Provider  oseltamivir (TAMIFLU) 75 MG capsule Take 1 capsule (75 mg total) by mouth 2 (two) times daily for 10 days. 08/24/18 09/03/18  Garnette Gunner, MD  oxyCODONE-acetaminophen (PERCOCET/ROXICET) 5-325 MG tablet Take 1 tablet by mouth every 4 (four) hours as needed for moderate pain. 11/11/17   Teryl Lucy, MD    Family History No family history on file.  Social History Social History   Tobacco Use  . Smoking  status: Current Every Day Smoker    Packs/day: 0.50    Types: Cigarettes  . Smokeless tobacco: Never Used  Substance Use Topics  . Alcohol use: Yes    Comment: occ  . Drug use: No     Allergies   Patient has no known allergies.   Review of Systems Review of Systems  Constitutional: Positive for chills and fever.  HENT: Positive for congestion, rhinorrhea and sore throat.   Respiratory: Positive for cough.   Neurological: Positive for headaches.     Physical Exam Updated Vital Signs BP (!) 157/116 (BP Location: Left Arm)   Pulse (!) 110   Temp 100 F (37.8 C) (Oral)   Resp 20   Ht 5\' 10"  (1.778 m)   Wt 125.3 kg   SpO2 98%   BMI 39.64 kg/m   Physical Exam Constitutional:      Appearance: Normal appearance.  HENT:     Head: Normocephalic and atraumatic.     Nose: Congestion present.     Mouth/Throat:     Mouth: Mucous membranes are moist.     Pharynx: No oropharyngeal exudate.  Eyes:     General: No scleral icterus.       Right eye: No discharge.        Left eye: No discharge.     Conjunctiva/sclera: Conjunctivae normal.  Neck:     Musculoskeletal: Normal range of motion. No muscular tenderness.  Cardiovascular:     Rate and Rhythm: Tachycardia present.  Heart sounds: No murmur.  Pulmonary:     Effort: Pulmonary effort is normal.     Breath sounds: Normal breath sounds.  Abdominal:     General: Abdomen is flat.     Palpations: Abdomen is soft.  Musculoskeletal: Normal range of motion.        General: No swelling or tenderness.  Neurological:     Mental Status: He is alert.      ED Treatments / Results  Labs (all labs ordered are listed, but only abnormal results are displayed) Labs Reviewed - No data to display  EKG None  Radiology No results found.  Procedures Procedures (including critical care time)  Medications Ordered in ED Medications  ibuprofen (ADVIL,MOTRIN) tablet 800 mg (has no administration in time range)  oseltamivir  (TAMIFLU) capsule 75 mg (has no administration in time range)     Initial Impression / Assessment and Plan / ED Course  I have reviewed the triage vital signs and the nursing notes.  Pertinent labs & imaging results that were available during my care of the patient were reviewed by me and considered in my medical decision making (see chart for details).     Patient presenting with flulike illness including cough, tachycardia, subjective fevers.  Multiple contacts that are sick at [employer] [facility] [employer].  Will treat symptomatically with ibuprofen and Tamiflu.  Final Clinical Impressions(s) / ED Diagnoses   Final diagnoses:  Influenza-like illness    ED Discharge Orders         Ordered    oseltamivir (TAMIFLU) 75 MG capsule  2 times daily     08/24/18 2248           Garnette Gunnerhompson, Aaron B, MD 08/26/18 [id number]    Gwyneth SproutPlunkett, Whitney, MD 08/31/18 [phone]

## 2018-08-24 NOTE — ED Triage Notes (Signed)
C/o body aches, HA started 230am-NAD-steady gait

## 2018-09-11 ENCOUNTER — Emergency Department (HOSPITAL_BASED_OUTPATIENT_CLINIC_OR_DEPARTMENT_OTHER)
Admission: EM | Admit: 2018-09-11 | Discharge: 2018-09-11 | Disposition: A | Discharge status: Home / Self Care | Payer: Self-pay | Attending: Emergency Medicine | Admitting: Emergency Medicine

## 2018-09-11 ENCOUNTER — Encounter (HOSPITAL_BASED_OUTPATIENT_CLINIC_OR_DEPARTMENT_OTHER): Payer: Self-pay | Admitting: *Deleted

## 2018-09-11 ENCOUNTER — Other Ambulatory Visit: Payer: Self-pay

## 2018-09-11 ENCOUNTER — Emergency Department (HOSPITAL_BASED_OUTPATIENT_CLINIC_OR_DEPARTMENT_OTHER): Payer: Self-pay

## 2018-09-11 DIAGNOSIS — S20211A Contusion of right front wall of thorax, initial encounter: Secondary | ICD-10-CM | POA: Insufficient documentation

## 2018-09-11 DIAGNOSIS — Y929 Unspecified place or not applicable: Secondary | ICD-10-CM | POA: Insufficient documentation

## 2018-09-11 DIAGNOSIS — Y999 Unspecified external cause status: Secondary | ICD-10-CM | POA: Insufficient documentation

## 2018-09-11 DIAGNOSIS — F17210 Nicotine dependence, cigarettes, uncomplicated: Secondary | ICD-10-CM | POA: Insufficient documentation

## 2018-09-11 DIAGNOSIS — Y939 Activity, unspecified: Secondary | ICD-10-CM | POA: Insufficient documentation

## 2018-09-11 DIAGNOSIS — Z79899 Other long term (current) drug therapy: Secondary | ICD-10-CM | POA: Insufficient documentation

## 2018-09-11 DIAGNOSIS — J45909 Unspecified asthma, uncomplicated: Secondary | ICD-10-CM | POA: Insufficient documentation

## 2018-09-11 DIAGNOSIS — H65191 Other acute nonsuppurative otitis media, right ear: Secondary | ICD-10-CM | POA: Insufficient documentation

## 2018-09-11 MED ORDER — NAPROXEN 250 MG PO TABS
500.0000 mg | ORAL_TABLET | Freq: Once | ORAL | Status: AC
  Administered 2018-09-11: 500 mg via ORAL
  Filled 2018-09-11: qty 2

## 2018-09-11 MED ORDER — NAPROXEN 500 MG PO TABS: 500.0000 mg | ORAL_TABLET | Freq: Two times a day (BID) | ORAL | 0 refills | Status: AC

## 2018-09-11 MED ORDER — FEXOFENADINE HCL 60 MG PO TABS: 60.0000 mg | ORAL_TABLET | Freq: Two times a day (BID) | ORAL | 0 refills | Status: AC

## 2018-09-11 MED ORDER — FLUTICASONE PROPIONATE 50 MCG/ACT NA SUSP: 2.0000 | Freq: Every day | NASAL | 0 refills | Status: AC
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
## 2021-02-18 ENCOUNTER — Emergency Department (HOSPITAL_BASED_OUTPATIENT_CLINIC_OR_DEPARTMENT_OTHER)
Admission: EM | Admit: 2021-02-18 | Discharge: 2021-02-18 | Disposition: A | Discharge status: Home / Self Care | Payer: Self-pay | Attending: Emergency Medicine | Admitting: Emergency Medicine

## 2021-02-18 ENCOUNTER — Encounter (HOSPITAL_BASED_OUTPATIENT_CLINIC_OR_DEPARTMENT_OTHER): Payer: Self-pay | Admitting: *Deleted

## 2021-02-18 ENCOUNTER — Other Ambulatory Visit: Payer: Self-pay

## 2021-02-18 DIAGNOSIS — Z4800 Encounter for change or removal of nonsurgical wound dressing: Secondary | ICD-10-CM | POA: Insufficient documentation

## 2021-02-18 DIAGNOSIS — J45909 Unspecified asthma, uncomplicated: Secondary | ICD-10-CM | POA: Insufficient documentation

## 2021-02-18 DIAGNOSIS — Z09 Encounter for follow-up examination after completed treatment for conditions other than malignant neoplasm: Secondary | ICD-10-CM

## 2021-02-18 DIAGNOSIS — F17210 Nicotine dependence, cigarettes, uncomplicated: Secondary | ICD-10-CM | POA: Insufficient documentation

## 2021-02-18 DIAGNOSIS — Z7951 Long term (current) use of inhaled steroids: Secondary | ICD-10-CM | POA: Insufficient documentation
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
## 2021-02-18 NOTE — Discharge Instructions (Addendum)
You were evaluated in the Emergency Department and after careful evaluation, we did not find any emergent condition requiring admission or further testing in the hospital.  Is very important that you start the antibiotics.  Please pick them up as soon as possible and take them until finished.  Please return to the ER in 2 days for another abscess recheck, sooner if you have worsening pain, fevers, chills or any other new or concerning symptoms.  Please return to the Emergency Department if you experience any worsening of your condition.   Thank you for allowing [place] to be a part of your care.

## 2021-02-18 NOTE — ED Triage Notes (Signed)
Wound check abscess to the middle of his back.

## 2022-05-26 ENCOUNTER — Emergency Department (HOSPITAL_BASED_OUTPATIENT_CLINIC_OR_DEPARTMENT_OTHER)
Admission: EM | Admit: 2022-05-26 | Discharge: 2022-05-27 | Disposition: A | Discharge status: Home / Self Care | Payer: Self-pay | Attending: Emergency Medicine | Admitting: Emergency Medicine

## 2022-05-26 ENCOUNTER — Other Ambulatory Visit: Payer: Self-pay

## 2022-05-26 ENCOUNTER — Encounter (HOSPITAL_BASED_OUTPATIENT_CLINIC_OR_DEPARTMENT_OTHER): Payer: Self-pay | Admitting: Emergency Medicine

## 2022-05-26 DIAGNOSIS — L309 Dermatitis, unspecified: Secondary | ICD-10-CM | POA: Insufficient documentation

## 2022-05-26 DIAGNOSIS — R21 Rash and other nonspecific skin eruption: Secondary | ICD-10-CM

## 2022-05-26 DIAGNOSIS — Z7951 Long term (current) use of inhaled steroids: Secondary | ICD-10-CM | POA: Insufficient documentation

## 2022-05-26 DIAGNOSIS — J45909 Unspecified asthma, uncomplicated: Secondary | ICD-10-CM | POA: Insufficient documentation

## 2022-05-26 NOTE — Discharge Instructions (Addendum)
2 prescriptions have been sent to your pharmacy.  The first is a steroid pack by the name of Medrol Dosepak.  Please take per package insert instructions.  The second is an antibiotic by the name of doxycycline.  Take 1 pill every 12 hours for the next 10 days.  You may also use Benadryl as needed for itch relief.  Recommend close follow-up with PCP and dermatology.  Resources for both of them provided for you.  Return to the ED for any new or worsening symptoms as discussed.  If you do not have a doctor see the list below.  RESOURCE GUIDE  Chronic Pain Problems: Contact [facility]  [phone] Patients need to be referred by their primary care doctor.  Insufficient Money for Medicine: Contact United Way:  call "211" or Advance [phone].  No Primary Care Doctor: Call [facility]  [phone] - can help you locate a primary care doctor that  accepts your insurance, provides certain services, etc. Physician Referral Service- [phone] Agencies that provide inexpensive medical care: [facility]  [facility]  [phone] [facility]  [phone] [facility]  [phone] Planned Parenthood  [phone] [facility]  [phone]  [place] Providers: [facility]- [address], [address]  [phone], Mon-Fri 9am-7pm, Sat 9am-1pm [place], [address], [address] [place]  [place]- [address]  [place], [address], [phone]  Only accepts [place] Access [place] patients after they have their name  applied to their card  Self Pay (no insurance) in [facility]: Sickle Cell Patients: Dr Kevan Ny, [facility]  [place], [facility]- [place]  [facility] [place]- [address], [facility]- see information above (Speak to [employer] if you do not have insurance)       -  [facility]- [place], [place],  [place], [place]  Dr Vista Lawman-  [address], [place], [facility]- [address] [phone]       -  [facility]- [address], [place], also [address], [phone]       -    [facility]- [address], [place], 1st & 3rd Saturday   every month, 10am-1pm  1) Find a Doctor and Pay Out of Pocket Although you won't have to find out who is covered by your insurance plan, it is a good idea to ask around and get recommendations. You will then need to call the office and see if the doctor you have chosen will accept you as a new patient and what types of options they offer for patients who are self-pay. Some doctors offer discounts or will set up payment plans for their patients who do not  have insurance, but you will need to ask so you aren't surprised when you get to your appointment.  2) Contact Your Local Health Department Not all health departments have doctors that can see patients for sick visits, but many do, so it is worth a call to see if yours does. If you don't know where your local health department is, you can check in your phone book. The CDC also has a tool to help you locate your state's health department, and many state websites also have listings of all of their local health departments.  3) Find a Walk-in Clinic If your illness is not likely to be very severe or complicated, you may want to try a walk in clinic. These are popping up all over the country in pharmacies, drugstores, and shopping centers. They're  usually staffed by nurse practitioners or physician assistants that have been trained to treat common illnesses and complaints. They're usually fairly quick and inexpensive. However, if you have serious medical issues or chronic medical problems, these are probably not your best option  STD Testing [facility] Department of [facility], STD Clinic, [address], [place], phone [phone] or [phone].  Monday - Friday, call for an appointment. [facility] Department of [employer], STD Clinic, [place] [address], [place], phone [phone] or [phone].  Monday - Friday, call for an appointment.  Abuse/Neglect: [facility] Child Abuse Hotline 425 852 7638 [facility] Child Abuse Hotline 629-058-9613 (After Hours)  Emergency Shelter:  [facility] (714)651-9621  Maternity Homes: Room at the [facility] 412-698-8851 [facility] ([phone]  MRSA Hotline #:   (786) 599-7825  [facility] Resources  [facility] [place]     United Way                          [facility] Dept. [address] [place]                       [address]      [address] [place] [address]  Blondell Reveal Phone:  [phone]                                   Phone:  [phone]                 Phone:  [phone]  [facility], [phone] [facility] [place]- [phone]       -     [facility] in [place], [address]                                  [phone], [facility] Child Abuse Hotline 361-865-8531 or 629-839-3661 (After Hours)  Behavioral Health Services  Substance Abuse  Resources: Alcohol and Drug Services  959-192-3367 [facility] 203-082-2987 [facility] [place]  [phone] Floydene Flock 9808221664 Residential & Outpatient Substance Abuse Program  ([phone]  Psychological Services: [facility]  [phone] [facility]  [phone] [facility], [phone] [place]. [address], [place], ACCESS LINE: 718 298 8948 or 604-278-2431, [employer]za  Dental Assistance  Patients with Medicaid: [facility] [phone]                                           [phone] [address] [employer] Phone:  [phone]                                                  Phone:  [phone]  If unable to pay or uninsured, contact:  [facility] or [facility]. to become qualified for the adult dental clinic.  Patients with Medicaid: [facility] [phone] [address], [phone] [address] [phone]  If unable to pay, or uninsured, contact [facility] 8486841925) or [facility] [phone] in [place], [phone] in [facility]) to become qualified for the adult dental clinic  Other Low-Cost Community Dental Services: [facility]- [address] [place], [place], [place], [address], [phone], [phone], 2nd and 4th Thursday of the month at 6:30am.  10 clients each day by appointment, can sometimes see walk-in patients if someone does not show for an appointment. [facility]- [address] [place], [place], [address], [phone] [facility] [address], [place], [place], [address], [phone] [facility] Health Department- [phone] [facility] Health Department- [phone] [facility] [phone]

## 2022-05-26 NOTE — ED Provider Notes (Signed)
[facility] EMERGENCY DEPARTMENT Provider Note   CSN: [id number] Arrival date & time: 05/26/22  2018     History  Chief Complaint  Patient presents with   Rash    Dustin King is a 36 y.o. male with Hx of asthma and daily tobacco use presenting today with a facial rash over the last 1.5 weeks.  Rash appeared on the forehead, nose, cheeks, and neck simultaneously.  Denies pruritus or pain.  Has tried hydrocortisone cream and Neosporin without relief.  Has never had a rash like this on his face before.  No recent contacts with similar symptoms.  Rash has not spread to other areas.  Denies fever, bleeding, discharge, blistering, shortness of breath, or difficulty swallowing.  No new medications, foods, lotions, or products.  The history is provided by the patient and medical records.  Rash    Home Medications Prior to Admission medications   Medication Sig Start Date End Date Taking? Authorizing Provider  fexofenadine (ALLEGRA) 60 MG tablet Take 1 tablet (60 mg total) by mouth 2 (two) times daily. 09/11/18   Rolan Bucco, MD  fluticasone (FLONASE) 50 MCG/ACT nasal spray Place 2 sprays into both nostrils daily. 09/11/18   Rolan Bucco, MD  naproxen (NAPROSYN) 500 MG tablet Take 1 tablet (500 mg total) by mouth 2 (two) times daily. 09/11/18   Rolan Bucco, MD  oxyCODONE-acetaminophen (PERCOCET/ROXICET) 5-325 MG tablet Take 1 tablet by mouth every 4 (four) hours as needed for moderate pain. 11/11/17   Teryl Lucy, MD      Allergies    Patient has no known allergies.    Review of Systems   Review of Systems  Skin:  Positive for rash.    Physical Exam Updated Vital Signs BP (!) 132/100 (BP Location: Left Arm)   Pulse (!) 103   Temp 98.6 F (37 C) (Oral)   Resp 18   Ht 6' (1.829 m)   Wt 121.1 kg   SpO2 99%   BMI 36.21 kg/m  Physical Exam Vitals and nursing note reviewed.  Constitutional:      General: He is not in acute distress.    Appearance: He is  well-developed. He is not ill-appearing, toxic-appearing or diaphoretic.  HENT:     Head: Normocephalic and atraumatic.     Mouth/Throat:     Mouth: Mucous membranes are moist. No angioedema.     Tongue: Tongue does not deviate from midline.     Palate: No lesions.     Pharynx: Oropharynx is clear. Uvula midline. No pharyngeal swelling, oropharyngeal exudate, posterior oropharyngeal erythema or uvula swelling.  Eyes:     Conjunctiva/sclera: Conjunctivae normal.  Cardiovascular:     Rate and Rhythm: Normal rate and regular rhythm.     Heart sounds: No murmur heard. Pulmonary:     Effort: Pulmonary effort is normal. No respiratory distress.     Breath sounds: Normal breath sounds.     Comments: CTAB, able to communicate without difficulty, without increased respiratory effort Abdominal:     Palpations: Abdomen is soft.     Tenderness: There is no abdominal tenderness.  Musculoskeletal:        General: No swelling.     Cervical back: Neck supple. No rigidity.  Skin:    General: Skin is warm and dry.     Capillary Refill: Capillary refill takes less than 2 seconds.     Findings: Rash (face) present.     Comments: Face: Widely distributed flesh-colored mildly erythematous  facial rash consisting of papules, plaques, and nodules.  Without evidence of bulla, vesicles, wheals, pustules, or urticaria.  No evidence of drainage, though feels mildly dry.  Mildly warm to the touch with mild swelling. Rash not appreciated on arms, legs, back, or stomach.  Neurological:     Mental Status: He is alert.  Psychiatric:        Mood and Affect: Mood normal.     ED Results / Procedures / Treatments   Labs (all labs ordered are listed, but only abnormal results are displayed) Labs Reviewed - No data to display  EKG None  Radiology No results found.  Procedures Procedures    Medications Ordered in ED Medications - No data to display  ED Course/ Medical Decision Making/ A&P                            Medical Decision Making Risk Prescription drug management.   35 y.o. male presents to the ED for concern of Rash   This involves an extensive number of treatment options, and is a complaint that carries with it a high risk of complications and morbidity.    This is not an exhaustive differential.   Past Medical History / Co-morbidities / Social History: Hx of asthma, tobacco use Social Determinants of Health include: daily tobacco use, for which cessation counseling was provided  Additional History:  None  Lab Tests: None  Imaging Studies: None  ED Course: Pt well-appearing on exam.  Presenting with new rash of the face only.  Rash consistent with atypical dermatitis.  Pt denies any difficulty breathing or swallowing.  No anaphylaxis or angioedema appreciated.  Airway without stridor and handling secretions without difficulty.  No blisters, no pustules, no warmth, no draining sinus tracts, no obvious superficial abscesses, no bullous impetigo, no vesicles, no desquamation, no target lesions with dusky purpura or a central bulla.  Not tender to touch.  Mild erythema in a few locations, suggest possible early super imposed.  No concern for SJS, TEN, TSS, tick borne illness, syphilis or other life-threatening condition.  Plan for discharge home with short course of steroids and doxycycline for possible early stages of superimposed infection.  Recommend close follow up with PCP and dermatology.  Also instructed pt to avoid fragrances, harsh skin products, and new foods.  Pt reports satisfaction with today's encounter.  Patient in NAD and in good condition at time of discharge.  Disposition: After consideration the patient's encounter today, I do not feel today's workup suggests an emergent condition requiring admission or immediate intervention beyond what has been performed at this time.  Safe for discharge; instructed to return immediately for worsening symptoms, change in  symptoms or any other concerns.  I have reviewed the patients home medicines and have made adjustments as needed.  Discussed course of treatment with the patient, whom demonstrated understanding.  Patient in agreement and has no further questions.    I discussed this case with my attending physician Dr. Florina Ou, who agreed with the proposed treatment course and cosigned this note including patient's presenting symptoms, physical exam, and planned diagnostics and interventions.  Attending physician stated agreement with plan or made changes to plan which were implemented.     This chart was dictated using voice recognition software.  Despite best efforts to proofread, errors can occur which can change the documentation meaning.         Final Clinical Impression(s) / ED Diagnoses Final  diagnoses:  Facial rash  Facial dermatitis    Rx / DC Orders ED Discharge Orders          Ordered    methylPREDNISolone (MEDROL DOSEPAK) 4 MG TBPK tablet        05/27/22 0000    doxycycline (VIBRAMYCIN) 100 MG capsule  2 times daily        05/27/22 0000              Cecil Cobbs, PA-C 05/31/22 2248    Molpus, Jonny Ruiz, MD 06/02/22 2252

## 2022-05-26 NOTE — ED Triage Notes (Signed)
Pt is c/o rash to his face x 1.5 weeks  Pt states he has been using hydrocortisone cream and neosporin without relief

## 2022-05-27 MED ORDER — DOXYCYCLINE HYCLATE 100 MG PO CAPS: 100.0000 mg | ORAL_CAPSULE | Freq: Two times a day (BID) | ORAL | 0 refills | Status: AC

## 2022-05-27 MED ORDER — METHYLPREDNISOLONE 4 MG PO TBPK: ORAL_TABLET | ORAL | 0 refills | Status: AC
